# Patient Record
Sex: Male | Born: 1985
Health system: Southern US, Community
[De-identification: ages and names within clinical notes are randomized; demographics above are authoritative.]

---

## 2016-10-08 DIAGNOSIS — J301 Allergic rhinitis due to pollen: Secondary | ICD-10-CM | POA: Diagnosis not present

## 2017-01-14 DIAGNOSIS — R739 Hyperglycemia, unspecified: Secondary | ICD-10-CM | POA: Diagnosis not present

## 2017-01-14 DIAGNOSIS — R7989 Other specified abnormal findings of blood chemistry: Secondary | ICD-10-CM | POA: Diagnosis not present

## 2017-01-14 DIAGNOSIS — R0683 Snoring: Secondary | ICD-10-CM | POA: Diagnosis not present

## 2017-01-14 DIAGNOSIS — Z23 Encounter for immunization: Secondary | ICD-10-CM | POA: Diagnosis not present

## 2017-01-17 DIAGNOSIS — K760 Fatty (change of) liver, not elsewhere classified: Secondary | ICD-10-CM | POA: Diagnosis not present

## 2017-01-17 DIAGNOSIS — R945 Abnormal results of liver function studies: Secondary | ICD-10-CM | POA: Diagnosis not present

## 2017-01-17 DIAGNOSIS — K819 Cholecystitis, unspecified: Secondary | ICD-10-CM | POA: Diagnosis not present

## 2017-03-12 DIAGNOSIS — K760 Fatty (change of) liver, not elsewhere classified: Secondary | ICD-10-CM | POA: Diagnosis not present

## 2017-03-12 DIAGNOSIS — R945 Abnormal results of liver function studies: Secondary | ICD-10-CM | POA: Diagnosis not present

## 2017-03-12 DIAGNOSIS — R932 Abnormal findings on diagnostic imaging of liver and biliary tract: Secondary | ICD-10-CM | POA: Diagnosis not present

## 2017-03-20 DIAGNOSIS — R0683 Snoring: Secondary | ICD-10-CM | POA: Diagnosis not present

## 2017-03-27 DIAGNOSIS — K648 Other hemorrhoids: Secondary | ICD-10-CM | POA: Diagnosis not present

## 2017-03-27 DIAGNOSIS — D122 Benign neoplasm of ascending colon: Secondary | ICD-10-CM | POA: Diagnosis not present

## 2017-03-27 DIAGNOSIS — K921 Melena: Secondary | ICD-10-CM | POA: Diagnosis not present

## 2017-03-27 DIAGNOSIS — D126 Benign neoplasm of colon, unspecified: Secondary | ICD-10-CM | POA: Diagnosis not present

## 2017-03-27 DIAGNOSIS — D125 Benign neoplasm of sigmoid colon: Secondary | ICD-10-CM | POA: Diagnosis not present

## 2017-03-27 DIAGNOSIS — K621 Rectal polyp: Secondary | ICD-10-CM | POA: Diagnosis not present

## 2017-04-02 DIAGNOSIS — Z23 Encounter for immunization: Secondary | ICD-10-CM | POA: Diagnosis not present

## 2017-04-16 DIAGNOSIS — R0683 Snoring: Secondary | ICD-10-CM | POA: Diagnosis not present

## 2017-04-16 DIAGNOSIS — G4733 Obstructive sleep apnea (adult) (pediatric): Secondary | ICD-10-CM | POA: Diagnosis not present

## 2017-05-20 DIAGNOSIS — R945 Abnormal results of liver function studies: Secondary | ICD-10-CM | POA: Diagnosis not present

## 2017-05-20 DIAGNOSIS — R932 Abnormal findings on diagnostic imaging of liver and biliary tract: Secondary | ICD-10-CM | POA: Diagnosis not present

## 2017-05-20 DIAGNOSIS — K760 Fatty (change of) liver, not elsewhere classified: Secondary | ICD-10-CM | POA: Diagnosis not present

## 2017-05-20 DIAGNOSIS — K7581 Nonalcoholic steatohepatitis (NASH): Secondary | ICD-10-CM | POA: Diagnosis not present

## 2017-05-21 DIAGNOSIS — G4733 Obstructive sleep apnea (adult) (pediatric): Secondary | ICD-10-CM | POA: Diagnosis not present

## 2017-06-17 DIAGNOSIS — R945 Abnormal results of liver function studies: Secondary | ICD-10-CM | POA: Diagnosis not present

## 2017-06-21 DIAGNOSIS — G4733 Obstructive sleep apnea (adult) (pediatric): Secondary | ICD-10-CM | POA: Diagnosis not present

## 2017-07-22 DIAGNOSIS — G4733 Obstructive sleep apnea (adult) (pediatric): Secondary | ICD-10-CM | POA: Diagnosis not present

## 2017-07-23 ENCOUNTER — Encounter: Payer: Self-pay | Admitting: Emergency Medicine

## 2017-07-23 ENCOUNTER — Other Ambulatory Visit: Payer: Self-pay

## 2017-07-23 ENCOUNTER — Emergency Department
Admission: EM | Admit: 2017-07-23 | Discharge: 2017-07-23 | Disposition: A | Discharge status: Home / Self Care | Payer: 59 | Attending: Emergency Medicine | Admitting: Emergency Medicine

## 2017-07-23 DIAGNOSIS — R42 Dizziness and giddiness: Secondary | ICD-10-CM | POA: Diagnosis not present

## 2017-07-23 LAB — URINALYSIS, COMPLETE (UACMP) WITH MICROSCOPIC
BACTERIA UA: NONE SEEN
BILIRUBIN URINE: NEGATIVE
Glucose, UA: NEGATIVE mg/dL
Hgb urine dipstick: NEGATIVE
Ketones, ur: NEGATIVE mg/dL
Leukocytes, UA: NEGATIVE
Nitrite: NEGATIVE
PROTEIN: 30 mg/dL — AB
SPECIFIC GRAVITY, URINE: 1.029 (ref 1.005–1.030)
pH: 5 (ref 5.0–8.0)

## 2017-07-23 LAB — CBC
HEMATOCRIT: 48.8 % (ref 40.0–52.0)
Hemoglobin: 16.8 g/dL (ref 13.0–18.0)
MCH: 28.9 pg (ref 26.0–34.0)
MCHC: 34.3 g/dL (ref 32.0–36.0)
MCV: 84.1 fL (ref 80.0–100.0)
Platelets: 338 10*3/uL (ref 150–440)
RBC: 5.8 MIL/uL (ref 4.40–5.90)
RDW: 13.3 % (ref 11.5–14.5)
WBC: 8.2 10*3/uL (ref 3.8–10.6)

## 2017-07-23 LAB — BASIC METABOLIC PANEL
Anion gap: 10 (ref 5–15)
BUN: 16 mg/dL (ref 6–20)
CHLORIDE: 102 mmol/L (ref 101–111)
CO2: 26 mmol/L (ref 22–32)
Calcium: 9.8 mg/dL (ref 8.9–10.3)
Creatinine, Ser: 1.14 mg/dL (ref 0.61–1.24)
GFR calc Af Amer: >60 mL/min (ref >60)
GFR calc non Af Amer: >60 mL/min (ref >60)
GLUCOSE: 147 mg/dL — AB (ref 65–99)
POTASSIUM: 3.7 mmol/L (ref 3.5–5.1)
Sodium: 138 mmol/L (ref 135–145)
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
## 2017-07-23 NOTE — ED Triage Notes (Signed)
Dizziness since Monday that is intermittent.  Describes as lightheaded. Denies fevers or pain that pt is aware of.  No cough.  Has felt mild increase when goes to stand up at times.  Speech clear. No droop.

## 2017-07-23 NOTE — ED Notes (Signed)
EDP to bedside at this time 
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
## 2017-07-31 DIAGNOSIS — G4733 Obstructive sleep apnea (adult) (pediatric): Secondary | ICD-10-CM | POA: Diagnosis not present

## 2017-08-19 DIAGNOSIS — G4733 Obstructive sleep apnea (adult) (pediatric): Secondary | ICD-10-CM | POA: Diagnosis not present

## 2017-08-22 DIAGNOSIS — G4733 Obstructive sleep apnea (adult) (pediatric): Secondary | ICD-10-CM | POA: Diagnosis not present

## 2017-08-26 DIAGNOSIS — R739 Hyperglycemia, unspecified: Secondary | ICD-10-CM | POA: Diagnosis not present

## 2017-08-26 DIAGNOSIS — K760 Fatty (change of) liver, not elsewhere classified: Secondary | ICD-10-CM | POA: Diagnosis not present

## 2017-08-26 DIAGNOSIS — Z0000 Encounter for general adult medical examination without abnormal findings: Secondary | ICD-10-CM | POA: Diagnosis not present

## 2017-09-19 DIAGNOSIS — G4733 Obstructive sleep apnea (adult) (pediatric): Secondary | ICD-10-CM | POA: Diagnosis not present

## 2017-09-23 DIAGNOSIS — J302 Other seasonal allergic rhinitis: Secondary | ICD-10-CM | POA: Diagnosis not present

## 2017-09-23 DIAGNOSIS — J40 Bronchitis, not specified as acute or chronic: Secondary | ICD-10-CM | POA: Diagnosis not present

## 2017-10-19 DIAGNOSIS — G4733 Obstructive sleep apnea (adult) (pediatric): Secondary | ICD-10-CM | POA: Diagnosis not present

## 2017-11-19 DIAGNOSIS — G4733 Obstructive sleep apnea (adult) (pediatric): Secondary | ICD-10-CM | POA: Diagnosis not present

## 2017-12-19 DIAGNOSIS — G4733 Obstructive sleep apnea (adult) (pediatric): Secondary | ICD-10-CM | POA: Diagnosis not present

## 2018-01-19 DIAGNOSIS — G4733 Obstructive sleep apnea (adult) (pediatric): Secondary | ICD-10-CM | POA: Diagnosis not present

## 2018-01-28 DIAGNOSIS — G4733 Obstructive sleep apnea (adult) (pediatric): Secondary | ICD-10-CM | POA: Diagnosis not present

## 2018-02-16 DIAGNOSIS — G4733 Obstructive sleep apnea (adult) (pediatric): Secondary | ICD-10-CM | POA: Diagnosis not present

## 2018-02-19 DIAGNOSIS — G4733 Obstructive sleep apnea (adult) (pediatric): Secondary | ICD-10-CM | POA: Diagnosis not present

## 2018-02-27 DIAGNOSIS — R739 Hyperglycemia, unspecified: Secondary | ICD-10-CM | POA: Diagnosis not present

## 2018-02-27 DIAGNOSIS — Z23 Encounter for immunization: Secondary | ICD-10-CM | POA: Diagnosis not present

## 2018-02-27 DIAGNOSIS — G4733 Obstructive sleep apnea (adult) (pediatric): Secondary | ICD-10-CM | POA: Diagnosis not present

## 2018-03-13 DIAGNOSIS — R748 Abnormal levels of other serum enzymes: Secondary | ICD-10-CM | POA: Diagnosis not present

## 2018-03-13 DIAGNOSIS — G4733 Obstructive sleep apnea (adult) (pediatric): Secondary | ICD-10-CM | POA: Diagnosis not present

## 2018-03-13 DIAGNOSIS — R739 Hyperglycemia, unspecified: Secondary | ICD-10-CM | POA: Diagnosis not present

## 2018-10-04 ENCOUNTER — Other Ambulatory Visit: Payer: Self-pay

## 2018-10-04 ENCOUNTER — Emergency Department
Admission: EM | Admit: 2018-10-04 | Discharge: 2018-10-04 | Disposition: A | Discharge status: Home / Self Care | Payer: 59 | Attending: Emergency Medicine | Admitting: Emergency Medicine

## 2018-10-04 ENCOUNTER — Emergency Department: Payer: 59

## 2018-10-04 DIAGNOSIS — M545 Low back pain: Secondary | ICD-10-CM | POA: Insufficient documentation

## 2018-10-04 DIAGNOSIS — R109 Unspecified abdominal pain: Secondary | ICD-10-CM | POA: Diagnosis not present

## 2018-10-04 DIAGNOSIS — M549 Dorsalgia, unspecified: Secondary | ICD-10-CM

## 2018-10-04 LAB — CBC WITH DIFFERENTIAL/PLATELET
Abs Immature Granulocytes: 0.03 10*3/uL (ref 0.00–0.07)
Basophils Absolute: 0 10*3/uL (ref 0.0–0.1)
Basophils Relative: 0 %
Eosinophils Absolute: 0 10*3/uL (ref 0.0–0.5)
Eosinophils Relative: 0 %
HCT: 46.4 % (ref 39.0–52.0)
Hemoglobin: 15.8 g/dL (ref 13.0–17.0)
Immature Granulocytes: 0 %
Lymphocytes Relative: 15 %
Lymphs Abs: 1.4 10*3/uL (ref 0.7–4.0)
MCH: 29 pg (ref 26.0–34.0)
MCHC: 34.1 g/dL (ref 30.0–36.0)
MCV: 85.3 fL (ref 80.0–100.0)
Monocytes Absolute: 0.7 10*3/uL (ref 0.1–1.0)
Monocytes Relative: 8 %
Neutro Abs: 7 10*3/uL (ref 1.7–7.7)
Neutrophils Relative %: 77 %
Platelets: 336 10*3/uL (ref 150–400)
RBC: 5.44 MIL/uL (ref 4.22–5.81)
RDW: 13.2 % (ref 11.5–15.5)
WBC: 9.2 10*3/uL (ref 4.0–10.5)
nRBC: 0 % (ref 0.0–0.2)

## 2018-10-04 LAB — URINALYSIS, COMPLETE (UACMP) WITH MICROSCOPIC
Bacteria, UA: NONE SEEN
Bilirubin Urine: NEGATIVE
Glucose, UA: NEGATIVE mg/dL
Hgb urine dipstick: NEGATIVE
Ketones, ur: NEGATIVE mg/dL
Leukocytes,Ua: NEGATIVE
Nitrite: NEGATIVE
Protein, ur: 100 mg/dL — AB
Specific Gravity, Urine: 1.041 — ABNORMAL HIGH (ref 1.005–1.030)
Squamous Epithelial / LPF: NONE SEEN (ref 0–5)
pH: 5 (ref 5.0–8.0)

## 2018-10-04 LAB — BASIC METABOLIC PANEL
Anion gap: 9 (ref 5–15)
BUN: 19 mg/dL (ref 6–20)
CO2: 27 mmol/L (ref 22–32)
Calcium: 9.9 mg/dL (ref 8.9–10.3)
Chloride: 101 mmol/L (ref 98–111)
Creatinine, Ser: 1.17 mg/dL (ref 0.61–1.24)
GFR calc Af Amer: >60 mL/min (ref >60)
GFR calc non Af Amer: >60 mL/min (ref >60)
Glucose, Bld: 135 mg/dL — ABNORMAL HIGH (ref 70–99)
Potassium: 4.1 mmol/L (ref 3.5–5.1)
Sodium: 137 mmol/L (ref 135–145)

## 2018-10-04 MED ORDER — KETOROLAC TROMETHAMINE 30 MG/ML IJ SOLN
15.0000 mg | Freq: Once | INTRAMUSCULAR | Status: AC
  Administered 2018-10-04: 15 mg via INTRAVENOUS
  Filled 2018-10-04: qty 1

## 2018-10-04 NOTE — Discharge Instructions (Addendum)
Return to the emergency room for any new or worrisome symptoms including but not limited to increased pain fever vomiting shortness of breath, numbness,  weakness or if you feel worse in any way.  Follow closely with your primary care doctor.

## 2018-10-04 NOTE — ED Provider Notes (Addendum)
[facility] Emergency Department Provider Note  ____________________________________________   I have reviewed the triage vital signs and the nursing notes. Where available I have reviewed prior notes and, if possible and indicated, outside hospital notes.    HISTORY  Chief Complaint Flank Pain    HPI Anthony Griffin is a 33 y.o. male  Who presents today complaining of right-sided flank pain no personal history of kidney stones no family history of kidney stones he knows a when I asked, pain is sharp, radiates a little to his groin no dysuria but has some mild hesitancy no frequency no fever no vomiting no abdominal pain, history of fatty liver and obesity in the past.  Denies any pleuritic component to this pain no shortness of breath began suddenly, no testicular pain or swelling, no change in bowel or bladder habits melena no bright red blood per rectum, sharp, seems to wax and wane.  Patient does have pain with motion.  Also noted to have a history of hepatic steatosis and cholelithiasis.  States that he did do some heavy lifting yesterday.    No past medical history on file.  There are no active problems to display for this patient.   No past surgical history on file.  Prior to Admission medications   Medication Sig Start Date End Date Taking? Authorizing Provider  dimenhyDRINATE (DRAMAMINE) 50 MG tablet Take 50 mg by mouth every 8 (eight) hours as needed.    [provider]    Allergies Patient has no known allergies.  No family history on file.  Social History Social History   Tobacco Use  . Smoking status: Never Smoker  . Smokeless tobacco: Never Used  Substance Use Topics  . Alcohol use: Yes    Frequency: Never  . Drug use: No    Review of Systems Constitutional: No fever/chills Eyes: No visual changes. ENT: No sore throat. No stiff neck no neck pain Cardiovascular: Denies chest pain. Respiratory: Denies shortness of  breath. Gastrointestinal:   no vomiting.  No diarrhea.  No constipation. Genitourinary: Negative for dysuria. Musculoskeletal: Negative lower extremity swelling Skin: Negative for rash. Neurological: Negative for severe headaches, focal weakness or numbness.   ____________________________________________   PHYSICAL EXAM:  VITAL SIGNS: ED Triage Vitals  Enc Vitals Group     BP 10/04/18 0723 (!) 143/100     Pulse Rate 10/04/18 0723 62     Resp 10/04/18 0723 18     Temp 10/04/18 0723 97.7 F (36.5 C)     Temp Source 10/04/18 0723 Oral     SpO2 10/04/18 0723 100 %     Weight 10/04/18 0625 230 lb (104.3 kg)     Height 10/04/18 0625 5\' 6"  (1.676 m)     Head Circumference --      Peak Flow --      Pain Score 10/04/18 0624 8     Pain Loc --      Pain Edu? --      Excl. in [place]? --     Constitutional: Alert and oriented. Well appearing and in no acute distress. Eyes: Conjunctivae are normal Head: Atraumatic HEENT: No congestion/rhinnorhea. Mucous membranes are moist.  Oropharynx non-erythematous Neck:   Nontender with no meningismus, no masses, no stridor Cardiovascular: Normal rate, regular rhythm. Grossly normal heart sounds.  Good peripheral circulation. Respiratory: Normal respiratory effort.  No retractions. Lungs CTAB. Abdominal: Soft and nontender. No distention. No guarding no rebound Back:  There is no focal tenderness or  step off.  there is no midline tenderness there are no lesions noted. there is + CVA tenderness  Musculoskeletal: No lower extremity tenderness, no upper extremity tenderness. No joint effusions, no DVT signs strong distal pulses no edema Neurologic:  Normal speech and language. No gross focal neurologic deficits are appreciated.  Skin:  Skin is warm, dry and intact. No rash noted. Psychiatric: Mood and affect are normal. Speech and behavior are normal.  ____________________________________________   LABS (all labs ordered are listed, but only  abnormal results are displayed)  Labs Reviewed  URINALYSIS, COMPLETE (UACMP) WITH MICROSCOPIC  CBC WITH DIFFERENTIAL/PLATELET  BASIC METABOLIC PANEL    Pertinent labs  results that were available during my care of the patient were reviewed by me and considered in my medical decision making (see chart for details). ____________________________________________  EKG  I personally interpreted any EKGs ordered by me or triage  ____________________________________________  RADIOLOGY  Pertinent labs & imaging results that were available during my care of the patient were reviewed by me and considered in my medical decision making (see chart for details). If possible, patient and/or family made aware of any abnormal findings.  No results found. ____________________________________________    PROCEDURES  Procedure(s) performed: None  Procedures  Critical Care performed: None  ____________________________________________   INITIAL IMPRESSION / ASSESSMENT AND PLAN / ED COURSE  Pertinent labs & imaging results that were available during my care of the patient were reviewed by me and considered in my medical decision making (see chart for details).  Patient here with sudden onset right flank pain in the middle the night, most likely this is a kidney stone musculoskeletal pain can do the same pleuritic pain does not seem to be likely implicated, he does not have any shortness of breath or pain when he breathes, therefore PE or pneumonia are not thought to be very likely, we will give him pain medications. at his age at [facility] counterindication to Toradol we will obtain CT scan urinalysis basic blood work and reassess.  ----------------------------------------- 8:27 AM on 10/04/2018 -----------------------------------------  Patient has a known history of cholelithiasis in the past he has no focal right upper quadrant abdominal pain he is having right-sided back pain.  Liver function test  white count CBC CMP urinalysis and CT scan are all reassuring however.  We will reassess.  CT scan does not show any evidence of pericholecystic fluid or inflammation fortunately  ----------------------------------------- 8:37 AM on 10/04/2018 -----------------------------------------  After Toradol, patient is pain-free unless he moves the wrong way, I can more localize the pain and it is mostly in the paraspinal muscles in the lower thoracic region, there is no erythema there is no shingles there is no evidence of shingles, there is no evidence of infection or abscess likely, there is no evidence of cauda equina syndrome there is no evidence of any neurological plan he has no saddle anesthesia, there is no testicular pain or swelling, his abdomen on serial exams even deep palpation the right upper and right lower quadrant are completely benign with no evidence of discomfort.  Cannot discount the possibility of early appendicitis but there is nothing seen on CT and his white count is normal he has no pain there, nor is there evidence of acute gallbladder pathology.  Patient is feeling 100% better wants to go home.  I feel most likely this is a musculoskeletal issue but return precautions follow-up given understood.  Does now recall that he was up in a tree cutting limbs  and twisting yesterday in ways does not normally do;  he did not fall.    ____________________________________________   FINAL CLINICAL IMPRESSION(S) / ED DIAGNOSES  Final diagnoses:  None      This chart was dictated using voice recognition software.  Despite best efforts to proofread,  errors can occur which can change meaning.      Schuyler Amor, MD 10/04/18 0383    Schuyler Amor, MD 10/04/18 3383    Schuyler Amor, MD 10/04/18 [phone]

## 2018-10-04 NOTE — ED Notes (Signed)
edp at bedside  

## 2018-10-04 NOTE — ED Notes (Addendum)
Pt c/o of R sided pain with back pain. Denies dysuria but states "it hurts to push". A&O, ambulatory.

## 2018-10-04 NOTE — ED Notes (Signed)
Pt taken to CT via wheelchair.

## 2018-10-04 NOTE — ED Triage Notes (Signed)
Patient reports right flank pain since 3:30 am.

## 2020-07-07 IMAGING — CT CT RENAL STONE PROTOCOL
2 of 4 series · 17 of 46 positions shown, 19 images · non-contrast
Comparison: None.

CLINICAL DATA: Right-sided flank pain

EXAM:
CT ABDOMEN AND PELVIS WITHOUT CONTRAST
TECHNIQUE: Multidetector CT imaging of the abdomen and pelvis was performed
following the standard protocol without IV contrast.

[Series 2: stone full standard · axial · 0.84mm/px · z∈[-481,-26]mm · 14 of 101 slices shown, 16 images]
[im 5/101  soft-tissue]
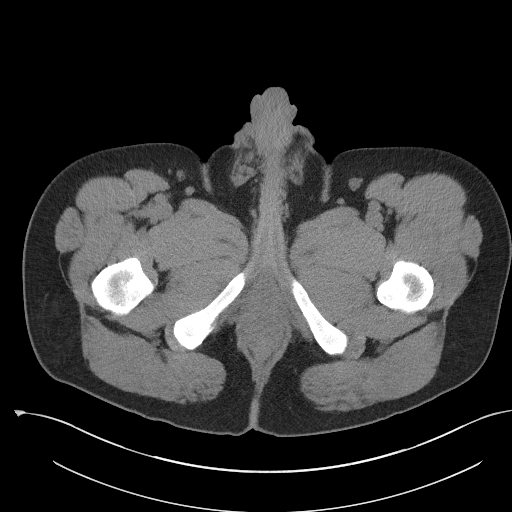
[im 5/101  bone]
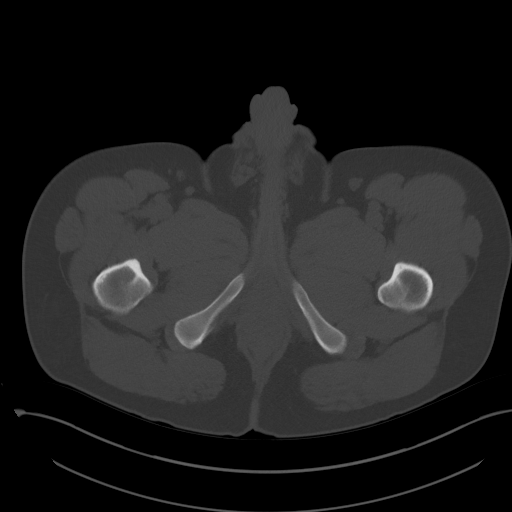
[im 14/101  soft-tissue]
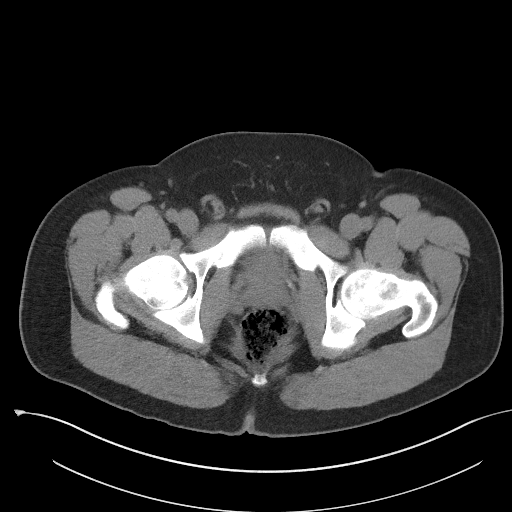
[im 18/101  soft-tissue]
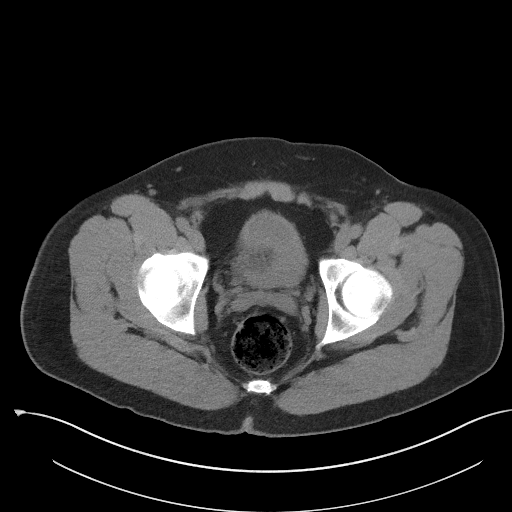
[im 27/101  soft-tissue]
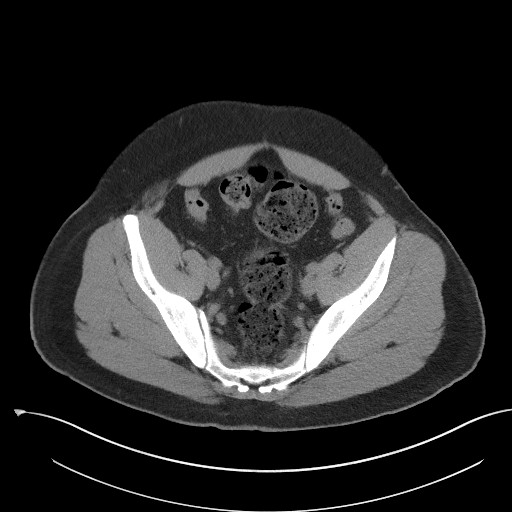
[im 35/101  soft-tissue]
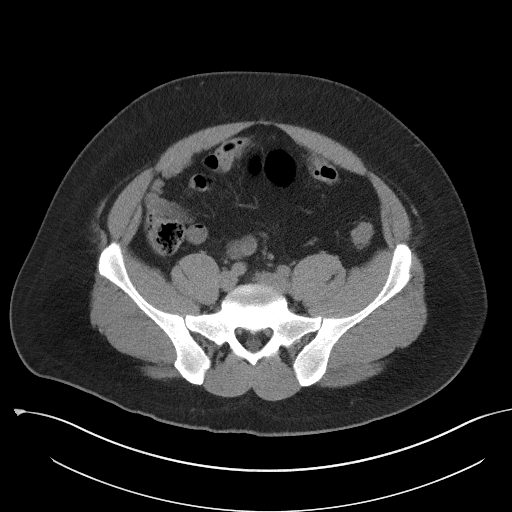
[im 40/101  soft-tissue]
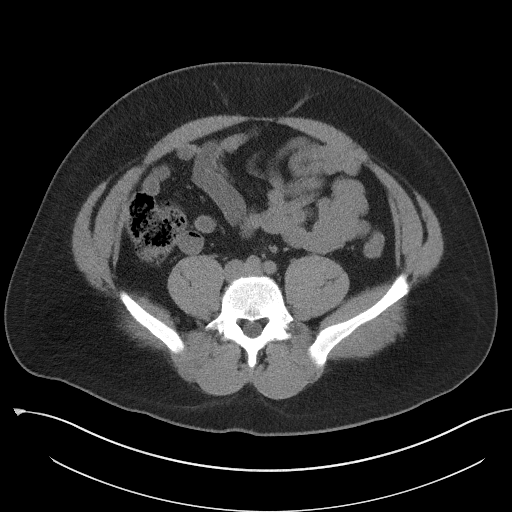
[im 48/101  soft-tissue]
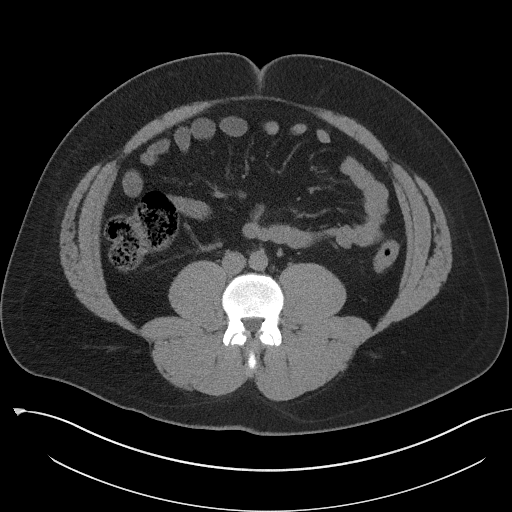
[im 53/101  soft-tissue]
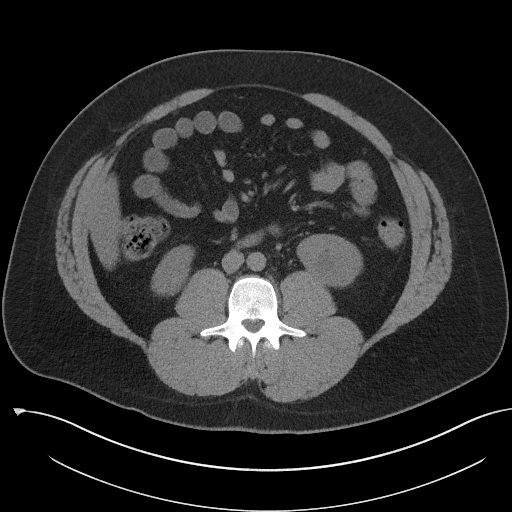
[im 61/101  soft-tissue]
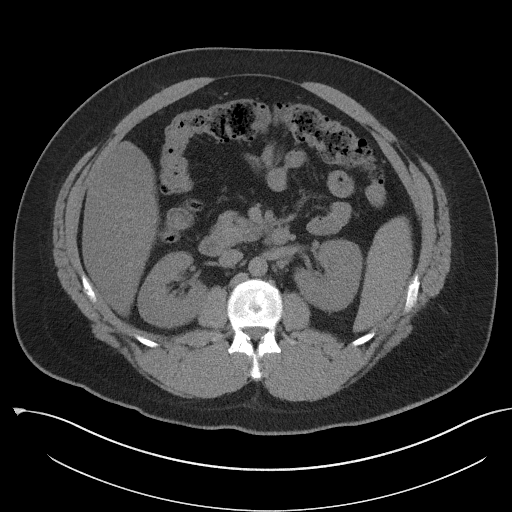
[im 61/101  bone]
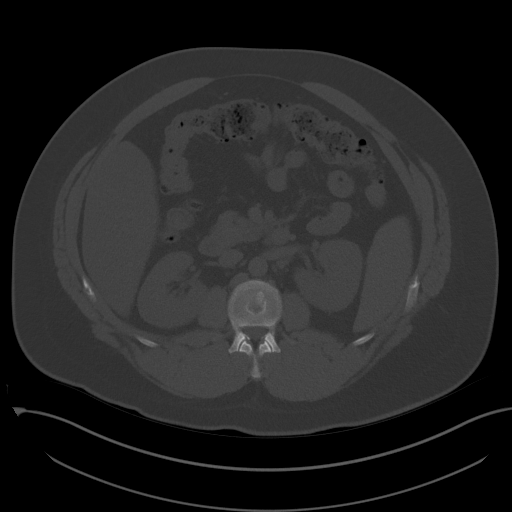
[im 66/101  soft-tissue]
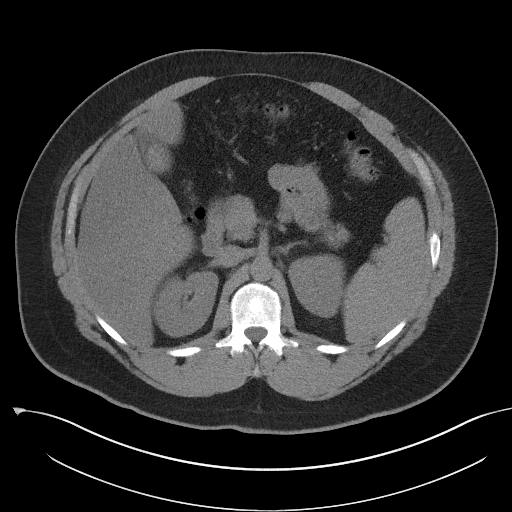
[im 74/101  soft-tissue]
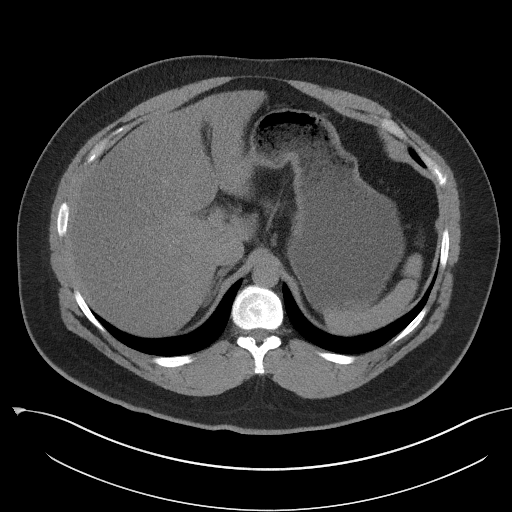
[im 83/101  soft-tissue]
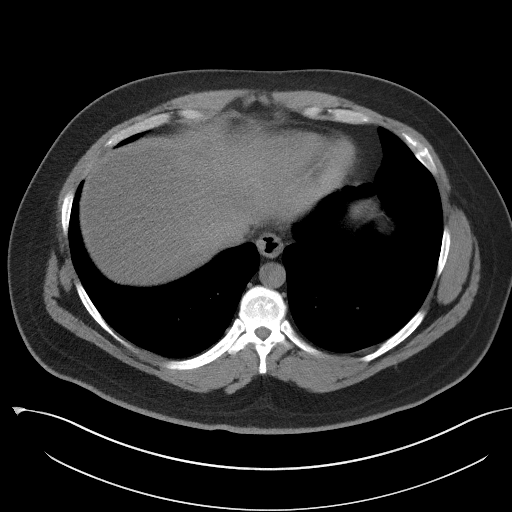
[im 87/101  soft-tissue]
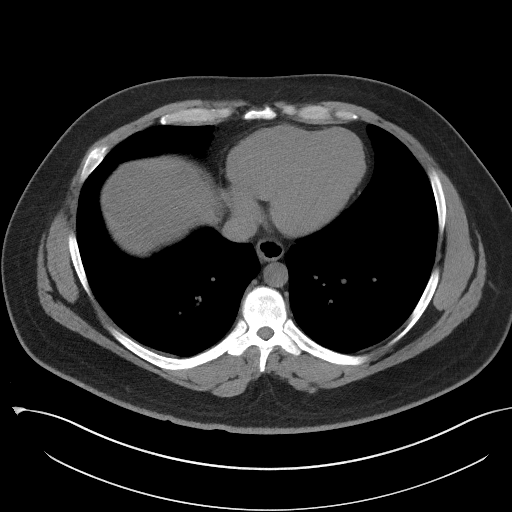
[im 96/101  soft-tissue]
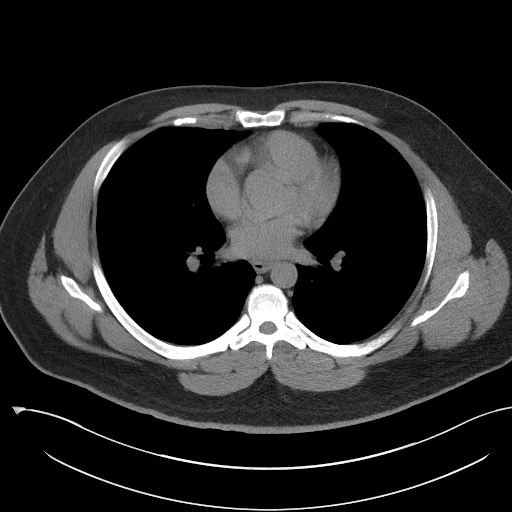

[Series 5: coronal · coronal · 0.86mm/px · 3 of 156 slices shown]
[im 52/156  soft-tissue]
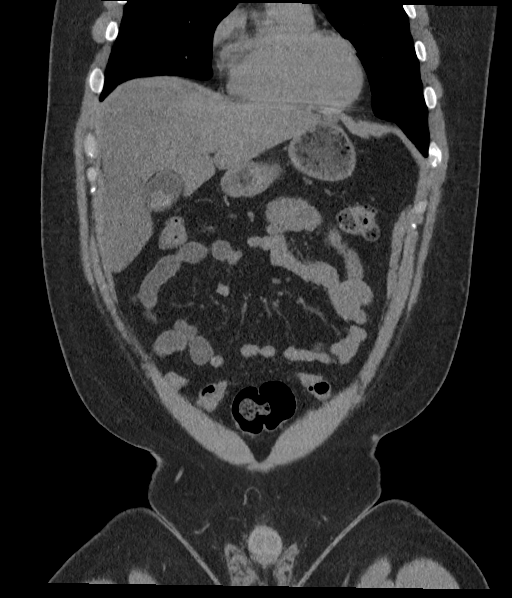
[im 69/156  soft-tissue]
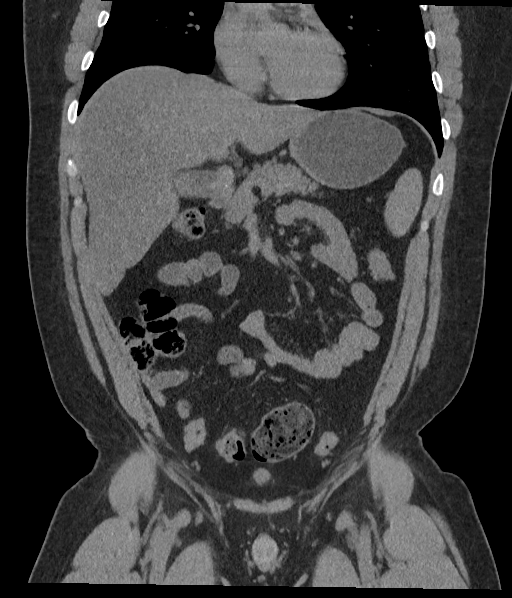
[im 87/156  soft-tissue]
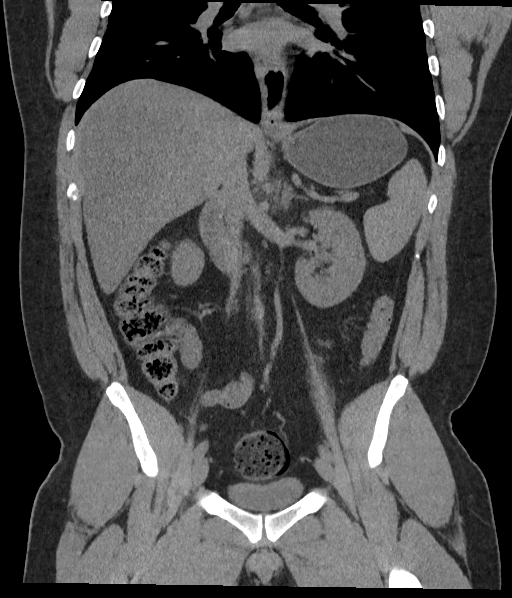

[17 of 46 positions shown; findings below may reference images not displayed]

FINDINGS: Lower chest:  No contributory findings.

Hepatobiliary: Prominent hepatic steatosis.Multiple calcified
gallstones. No evidence of acute cholecystitis.

Pancreas: Unremarkable.

Spleen: Unremarkable.

Adrenals/Urinary Tract: Negative adrenals. No hydronephrosis or
stone. Small cystic density in the lower pole left kidney.
Unremarkable bladder.

Stomach/Bowel:  No obstruction. No appendicitis.

Vascular/Lymphatic: No acute vascular abnormality. No mass or
adenopathy.

Reproductive:No pathologic findings.

Other: No ascites or pneumoperitoneum.

Musculoskeletal: No acute abnormalities.
IMPRESSION: 1. No acute finding.  No hydronephrosis or urolithiasis.
2. Hepatic steatosis and cholelithiasis.

## 2024-06-08 ENCOUNTER — Ambulatory Visit

## 2024-06-08 DIAGNOSIS — D122 Benign neoplasm of ascending colon: Secondary | ICD-10-CM | POA: Diagnosis not present

## 2024-06-08 DIAGNOSIS — K5730 Diverticulosis of large intestine without perforation or abscess without bleeding: Secondary | ICD-10-CM | POA: Diagnosis not present

## 2024-06-08 DIAGNOSIS — Z860101 Personal history of adenomatous and serrated colon polyps: Secondary | ICD-10-CM | POA: Diagnosis not present

## 2024-06-08 DIAGNOSIS — Z09 Encounter for follow-up examination after completed treatment for conditions other than malignant neoplasm: Secondary | ICD-10-CM | POA: Diagnosis present
